# Patient Record
Sex: Female | Born: 1960 | Hispanic: No | Marital: Married | Smoking: Former smoker
Health system: Southern US, Community
[De-identification: ages and names within clinical notes are randomized; demographics above are authoritative.]

## PROBLEM LIST (undated history)

## (undated) DIAGNOSIS — G43909 Migraine, unspecified, not intractable, without status migrainosus: Secondary | ICD-10-CM

## (undated) DIAGNOSIS — N809 Endometriosis, unspecified: Secondary | ICD-10-CM

## (undated) HISTORY — PX: BREAST SURGERY: SHX581

## (undated) HISTORY — DX: Endometriosis, unspecified: N80.9

## (undated) HISTORY — DX: Migraine, unspecified, not intractable, without status migrainosus: G43.909

---

## 1995-07-18 HISTORY — PX: WISDOM TOOTH EXTRACTION: SHX21

## 1997-07-17 DIAGNOSIS — N809 Endometriosis, unspecified: Secondary | ICD-10-CM

## 1997-07-17 HISTORY — PX: APPENDECTOMY: SHX54

## 1997-07-17 HISTORY — DX: Endometriosis, unspecified: N80.9

## 1998-05-17 HISTORY — PX: ABDOMINAL HYSTERECTOMY: SHX81

## 1998-07-17 HISTORY — PX: AUGMENTATION MAMMAPLASTY: SUR837

## 2014-05-11 ENCOUNTER — Encounter: Payer: Self-pay | Admitting: Family Medicine

## 2014-05-11 ENCOUNTER — Ambulatory Visit: Payer: Self-pay | Attending: Family Medicine | Admitting: Family Medicine

## 2014-05-11 VITALS — BP 91/62 | HR 75 | Temp 98.1°F | Resp 16 | Ht 63.0 in | Wt 95.0 lb

## 2014-05-11 DIAGNOSIS — L989 Disorder of the skin and subcutaneous tissue, unspecified: Secondary | ICD-10-CM | POA: Insufficient documentation

## 2014-05-11 DIAGNOSIS — Z23 Encounter for immunization: Secondary | ICD-10-CM

## 2014-05-11 DIAGNOSIS — L821 Other seborrheic keratosis: Secondary | ICD-10-CM

## 2014-05-11 DIAGNOSIS — L918 Other hypertrophic disorders of the skin: Secondary | ICD-10-CM

## 2014-05-11 DIAGNOSIS — D2261 Melanocytic nevi of right upper limb, including shoulder: Secondary | ICD-10-CM | POA: Insufficient documentation

## 2014-05-11 NOTE — Assessment & Plan Note (Signed)
A: multiple benign appearing lesions on face and body. P: Reassurance Cryotherapy done to R shoulder SK

## 2014-05-11 NOTE — Patient Instructions (Signed)
Mrs. Apperson,  Thank you for coming in today. It was a pleasure meeting you. I look forward to being your primary doctor.  You have skin tags and seborrheic keratosis. I treated the seborrheic keratosis on your R shoulder today. I see not evidence of skin cancer.  Plan for yearly skin check.  Recommend yearly physical Health maintenance: pap smear ever 3-5 years, mammogram yearly. Screening colonoscopy at age 53.   Dr. Adrian Blackwater   Please call Rolena Infante, [phone],  with the BCCCP (breast and cervical cancer control program) at the [facility] to set up an appointment to verify eligibility for a breast exam, mammogram, ultrasound. If you qualify this will be set up at [facility].

## 2014-05-11 NOTE — Progress Notes (Signed)
Establish Care Pt stated Mole on Rt shoulder change color, without pain or itching.

## 2014-05-11 NOTE — Assessment & Plan Note (Signed)
A: multiple benign appearing lesions on face and body. P: Reassurance

## 2014-05-11 NOTE — Progress Notes (Signed)
   Subjective:    Patient ID: Abigail Brady, female    DOB: Dec 23, 1960, 53 y.o.   MRN: [id number] CC; establish care, R shoulder mole  HPI 53 yo F presents with her husband to establish care and discuss the following:  1. Skin lesion: patient with R shoulder skin lesion that is changing color. There is no pain, itching, change in size, bleeding. She has multiple areas on her face, chest, shoulder, back, abdomen and legs that she believes are moles. She has a negative history of skin cancer. She has had minimal sun exposure over her lifetime. She is a natural brunette.   Soc hx: non smoker Med hx: migraines Surg hx: breast augmentation surgery  fam hx: negative for skin cancer  Review of Systems As per HPI    Objective:   Physical Exam  Skin: Lesion noted.      BP 91/62  Pulse 75  Temp(Src) 98.1 F (36.7 C) (Oral)  Resp 16  Ht 5\' 3"  (1.6 m)  Wt 95 lb (43.092 kg)  BMI 16.83 kg/m2  SpO2 97% General appearance: alert, cooperative and no distress        Assessment & Plan:

## 2014-05-20 ENCOUNTER — Ambulatory Visit: Payer: Self-pay

## 2014-05-22 ENCOUNTER — Ambulatory Visit: Payer: Self-pay | Attending: Family Medicine

## 2014-06-01 ENCOUNTER — Ambulatory Visit: Payer: Self-pay

## 2014-10-06 ENCOUNTER — Telehealth: Payer: Self-pay | Admitting: *Deleted

## 2014-10-06 ENCOUNTER — Encounter: Payer: Self-pay | Admitting: Family Medicine

## 2014-10-06 ENCOUNTER — Ambulatory Visit: Payer: Self-pay | Attending: Family Medicine | Admitting: Family Medicine

## 2014-10-06 ENCOUNTER — Ambulatory Visit (HOSPITAL_COMMUNITY)
Admission: RE | Admit: 2014-10-06 | Discharge: 2014-10-06 | Disposition: A | Discharge status: Home / Self Care | Payer: Self-pay | Source: Ambulatory Visit | Attending: Family Medicine | Admitting: Family Medicine

## 2014-10-06 VITALS — BP 104/62 | HR 95 | Temp 98.2°F | Resp 16 | Ht 63.0 in | Wt 99.0 lb

## 2014-10-06 DIAGNOSIS — S99911A Unspecified injury of right ankle, initial encounter: Secondary | ICD-10-CM

## 2014-10-06 DIAGNOSIS — S8261XA Displaced fracture of lateral malleolus of right fibula, initial encounter for closed fracture: Secondary | ICD-10-CM | POA: Insufficient documentation

## 2014-10-06 DIAGNOSIS — Y939 Activity, unspecified: Secondary | ICD-10-CM | POA: Insufficient documentation

## 2014-10-06 DIAGNOSIS — Y9301 Activity, walking, marching and hiking: Secondary | ICD-10-CM | POA: Insufficient documentation

## 2014-10-06 DIAGNOSIS — X58XXXA Exposure to other specified factors, initial encounter: Secondary | ICD-10-CM | POA: Insufficient documentation

## 2014-10-06 LAB — COMPLETE METABOLIC PANEL WITH GFR
ALK PHOS: 54 U/L (ref 39–117)
ALT: 14 U/L (ref 0–35)
AST: 20 U/L (ref 0–37)
Albumin: 4.7 g/dL (ref 3.5–5.2)
BILIRUBIN TOTAL: 0.5 mg/dL (ref 0.2–1.2)
BUN: 10 mg/dL (ref 6–23)
CO2: 28 mEq/L (ref 19–32)
CREATININE: 0.56 mg/dL (ref 0.50–1.10)
Calcium: 9.4 mg/dL (ref 8.4–10.5)
Chloride: 103 mEq/L (ref 96–112)
GFR, Est African American: >89 mL/min
Glucose, Bld: 98 mg/dL (ref 70–99)
Potassium: 3.8 mEq/L (ref 3.5–5.3)
SODIUM: 140 meq/L (ref 135–145)
Total Protein: 6.8 g/dL (ref 6.0–8.3)

## 2014-10-06 NOTE — Patient Instructions (Signed)
Abigail Brady,  Thank you for coming in today.  1. R ankle injury: sprain vs fracture  X-ray of R ankle  Checking vit D and calcium  Continue compression, elevation and ice (20 minutes 2-3 times per day)   Ortho if needed will be referred to [facility] (there is a $150 co-pay due at time of visit).   You will be called with x-ray results, labs and f/u plan.   Dr. Adrian Blackwater

## 2014-10-06 NOTE — Progress Notes (Signed)
   Subjective:    Patient ID: Abigail Brady, female    DOB: Jul 14, 1961, 54 y.o.   MRN: [id number] CC: twisting injury of R ankle  HPI  1. R ankle injury: on 09/19/14. Patient stepped off curb and twisted ankle. Bruising immediately and swelling. No pain. Icing, elevating and using compression brace. Swelling has decreased. Bruising remains. Patient stretching ankle and knees at home.   Soc Hx: non smoker, vegetarian  Medical Hx: negative for broken bones  Review of Systems  Constitutional: Negative for fever.  Respiratory: Negative for shortness of breath.   Cardiovascular: Negative for chest pain.  Musculoskeletal: Positive for joint swelling.      Objective:   Physical Exam BP 104/62 mmHg  Pulse 95  Temp(Src) 98.2 F (36.8 C) (Oral)  Resp 16  Ht 5\' 3"  (1.6 m)  Wt 99 lb (44.906 kg)  BMI 17.54 kg/m2  SpO2 98% General appearance: alert, cooperative and no distress Lungs: normal WOB  Extremities: R ankle with mild to moderate swelling and brusing spreading inferiorly from medial, malleolus to lateral malleolus,  Pulses: 2+ and symmetric Skin: warm and dry, bruising noted , no sores or lesions  Neurologic: sensation intact in feet  MSK: thin build, full ROM of ankles      Assessment & Plan:

## 2014-10-06 NOTE — Progress Notes (Signed)
Injury on rt ankle x2 weeks ago  bruise  And swelling no pain no ROM

## 2014-10-06 NOTE — Telephone Encounter (Signed)
Pt aware of results Will buy cam boot tomorrow      Result Notes    Notes Recorded by Boykin Nearing, MD on 10/06/2014 at 2:36 PM Please call patient  Lateral malleolus fracture. Distal tibia.  Based on x-ray transverse fracture through lateral malleolus  Plan: CAM walker Fracture should heal will refer to ortho but plan to monitor with f/u x-ray myself if ortho not accessible due to lack of insurance.

## 2014-10-07 LAB — VITAMIN D 25 HYDROXY (VIT D DEFICIENCY, FRACTURES): Vit D, 25-Hydroxy: 34 ng/mL (ref 30–100)

## 2014-10-07 NOTE — Assessment & Plan Note (Addendum)
A: R ankle injury two weeks ago. Sprain vs fracture. At risk for vit D def. No pain.  P: Vit D and calcium check  X-rays ordered, reported and reviewed. R lateral malleolus fracture  CAM walked - $60 at [place] supply.  Patient uninsured-  Home PT F/u x-ray in 6 weeks  Ortho referral of there is delayed healing

## 2014-10-08 ENCOUNTER — Telehealth: Payer: Self-pay | Admitting: *Deleted

## 2014-10-08 NOTE — Telephone Encounter (Signed)
Pt aware of results 

## 2014-10-08 NOTE — Telephone Encounter (Signed)
-----   Message from Boykin Nearing, MD sent at 10/07/2014  7:44 AM EDT ----- Vit D and calcium normal

## 2014-11-17 ENCOUNTER — Telehealth: Payer: Self-pay | Admitting: Family Medicine

## 2014-11-17 DIAGNOSIS — S8261XA Displaced fracture of lateral malleolus of right fibula, initial encounter for closed fracture: Secondary | ICD-10-CM

## 2014-11-17 NOTE — Telephone Encounter (Signed)
Referral placed.

## 2014-11-17 NOTE — Telephone Encounter (Signed)
Patient called to request an Ortho referral to [facility]. Please f/u

## 2015-07-25 IMAGING — CR DG ANKLE COMPLETE 3+V*R*
3 series · 3 of 3 positions shown · non-contrast
Comparison: None.

CLINICAL DATA: Twisting injury of the right ankle September 19, 2014,
initial visit

EXAM:
RIGHT ANKLE - COMPLETE 3+ VIEW

[x ankle ap right]
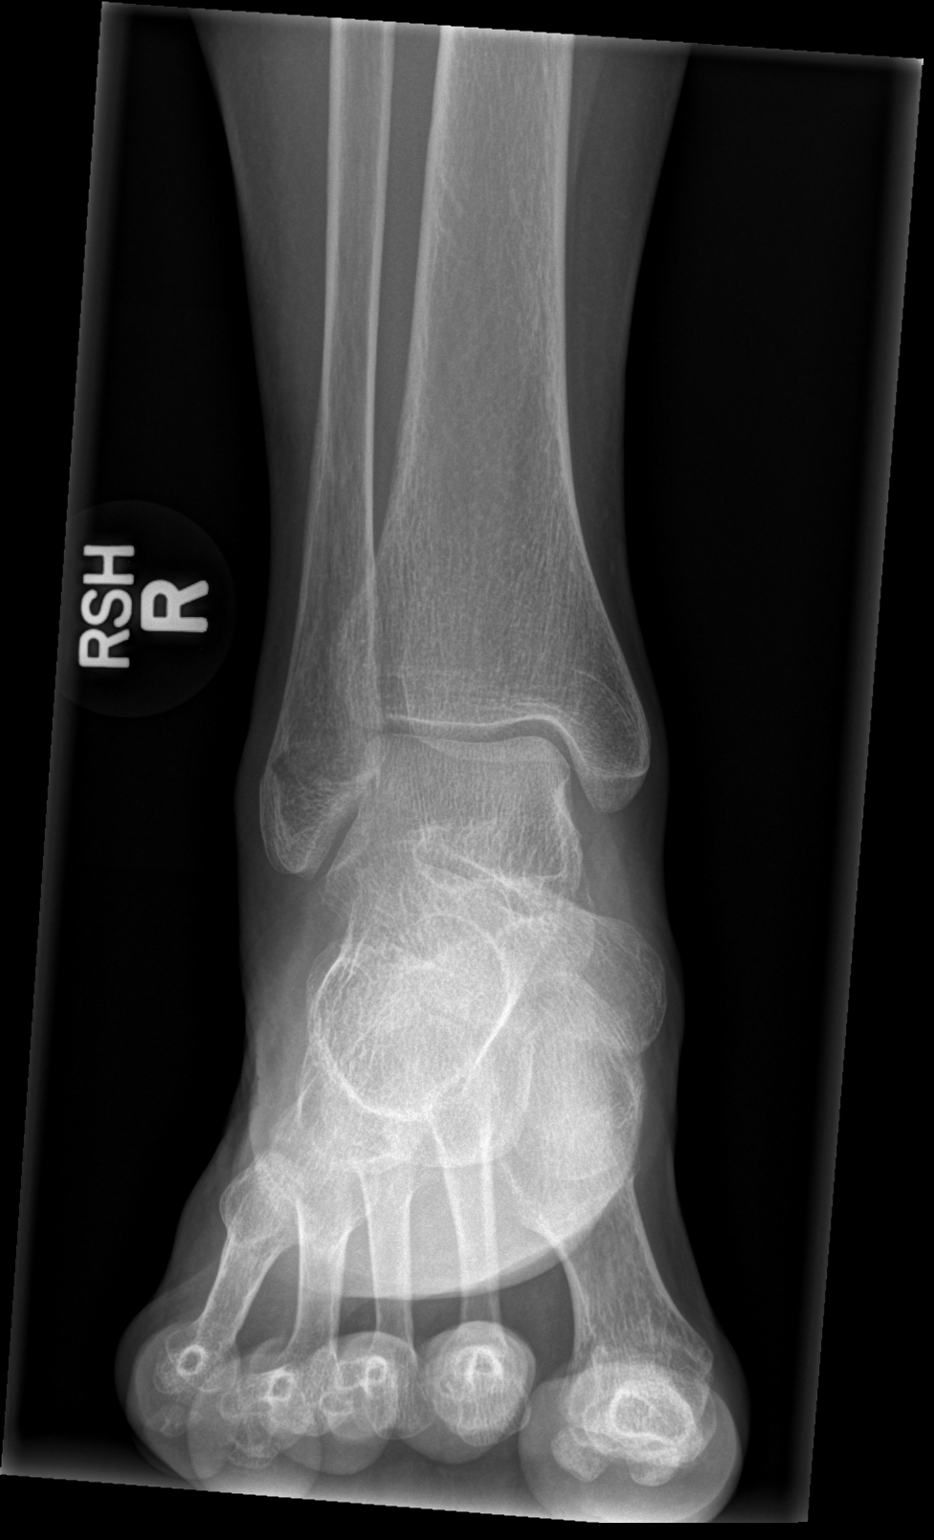

[x ankle obl right]
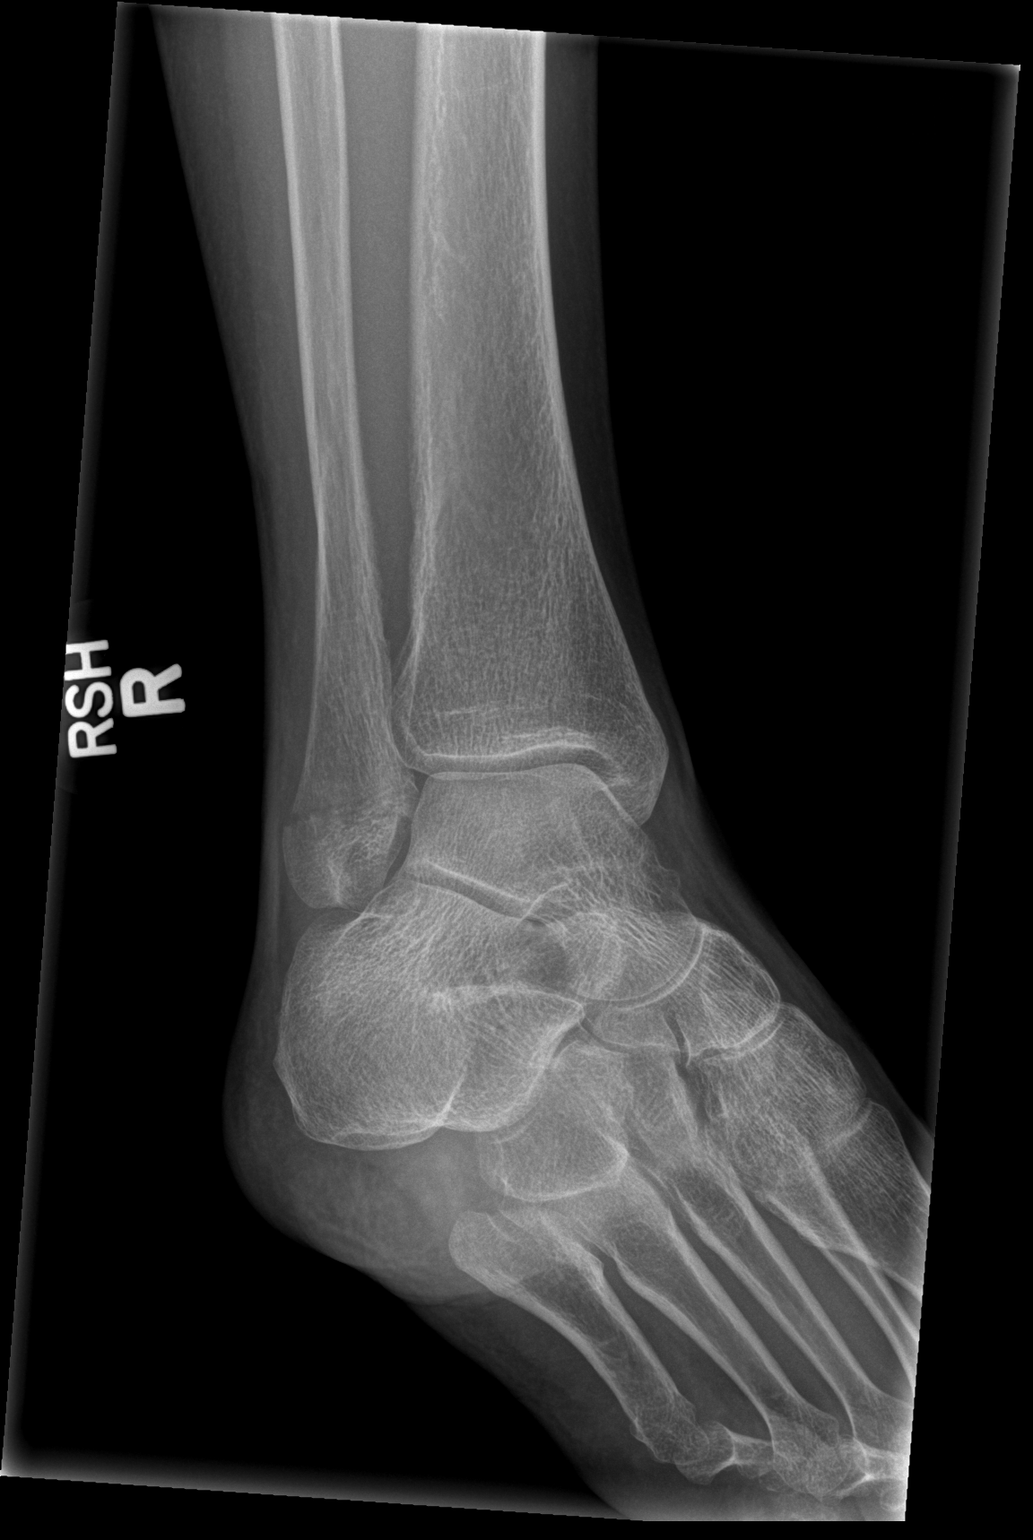

[x ankle lat right]
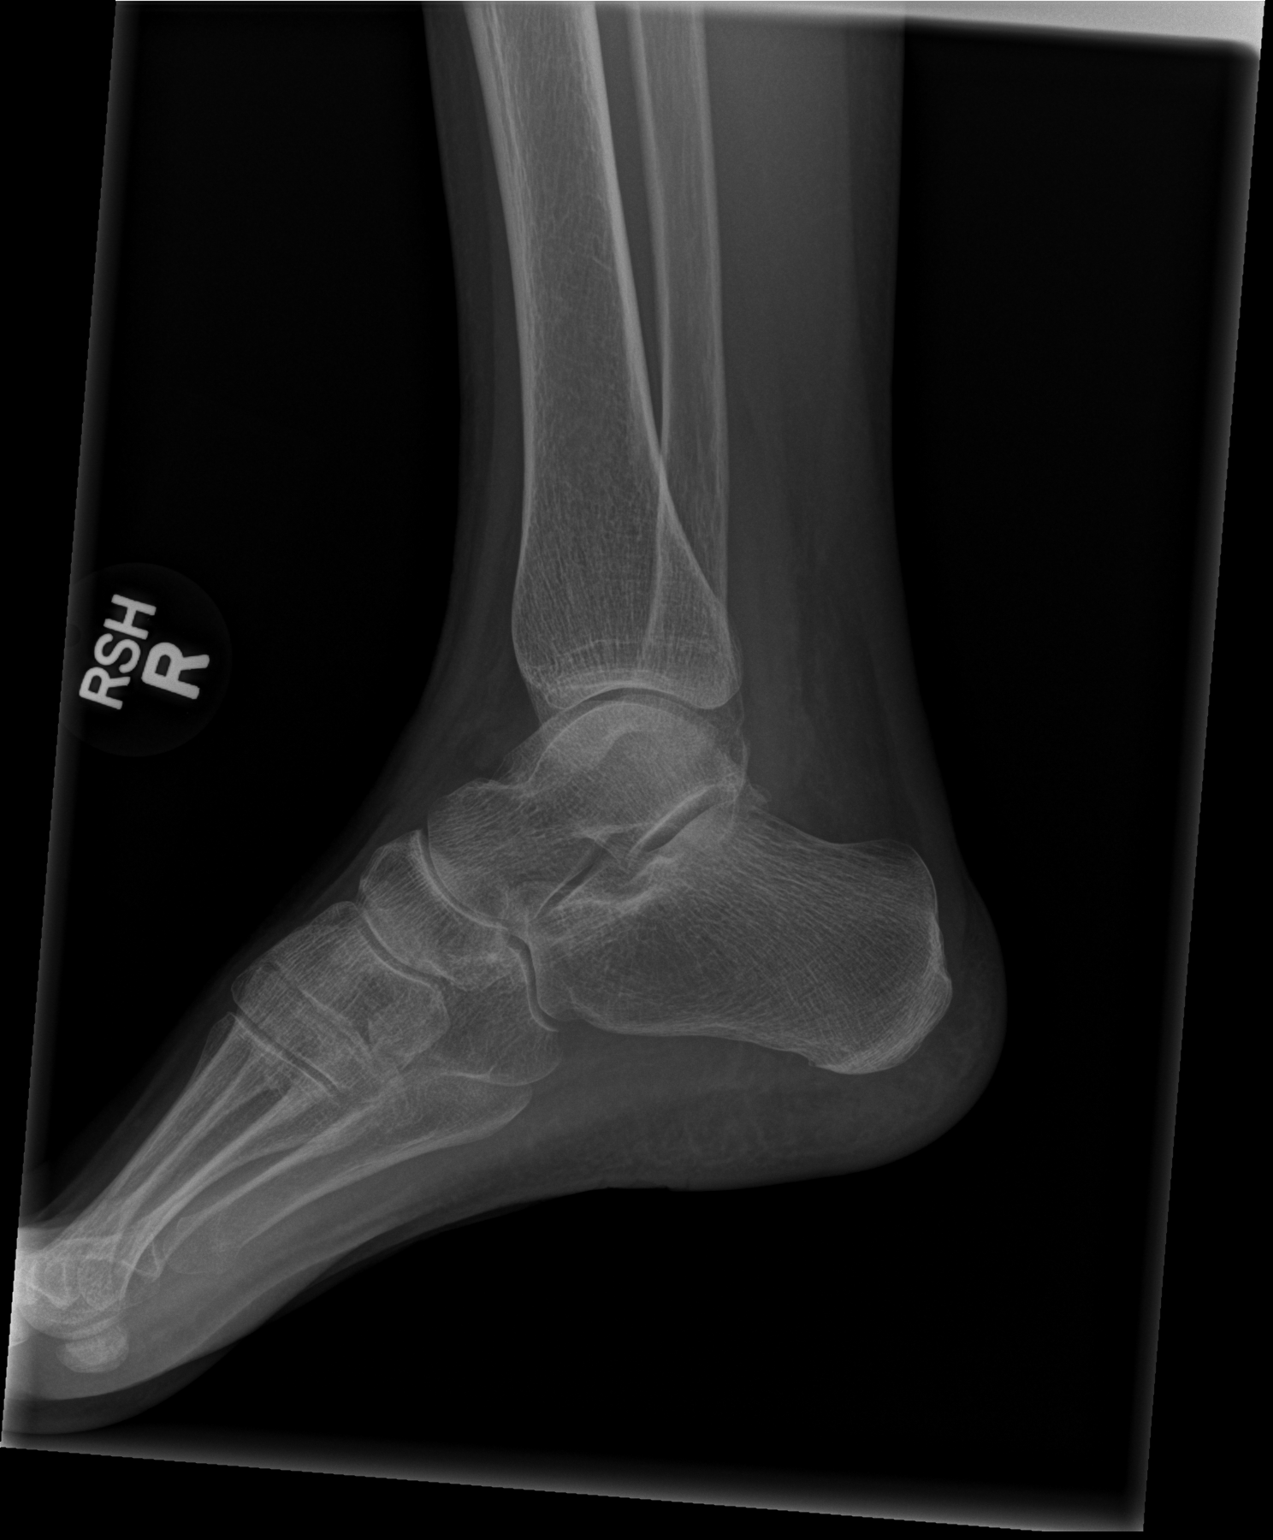

[3 of 3 positions shown; findings below may reference images not displayed]

FINDINGS: There is a transversely oriented lucency through the lateral
malleolus. The medial and posterior malleoli are intact. The ankle
joint mortise is preserved. The talar dome correction the talus and
calcaneus are unremarkable. The metatarsals are intact where
visualized.
IMPRESSION: There is a subacute minimally distracted transversely oriented
fracture through the lateral malleolus. The medial and posterior
malleoli are intact.
